# Patient Record
Sex: Female | Born: 1960 | Race: White | Hispanic: No | Marital: Married | Smoking: Current every day smoker
Health system: Southern US, Community
[De-identification: ages and names within clinical notes are randomized; demographics above are authoritative.]

## PROBLEM LIST (undated history)

## (undated) DIAGNOSIS — J45909 Unspecified asthma, uncomplicated: Secondary | ICD-10-CM

## (undated) DIAGNOSIS — I10 Essential (primary) hypertension: Secondary | ICD-10-CM

## (undated) HISTORY — PX: ABDOMINAL HYSTERECTOMY: SHX81

## (undated) HISTORY — PX: OTHER SURGICAL HISTORY: SHX169

## (undated) HISTORY — PX: CHOLECYSTECTOMY: SHX55

## (undated) HISTORY — PX: HERNIA REPAIR: SHX51

---

## 2015-05-24 ENCOUNTER — Encounter (HOSPITAL_COMMUNITY): Payer: Self-pay | Admitting: Emergency Medicine

## 2015-05-24 ENCOUNTER — Emergency Department (HOSPITAL_COMMUNITY): Payer: 59

## 2015-05-24 ENCOUNTER — Emergency Department (HOSPITAL_COMMUNITY)
Admission: EM | Admit: 2015-05-24 | Discharge: 2015-05-24 | Disposition: A | Discharge status: Home / Self Care | Payer: 59 | Attending: Emergency Medicine | Admitting: Emergency Medicine

## 2015-05-24 DIAGNOSIS — J45909 Unspecified asthma, uncomplicated: Secondary | ICD-10-CM | POA: Insufficient documentation

## 2015-05-24 DIAGNOSIS — I10 Essential (primary) hypertension: Secondary | ICD-10-CM | POA: Insufficient documentation

## 2015-05-24 DIAGNOSIS — Z79899 Other long term (current) drug therapy: Secondary | ICD-10-CM | POA: Insufficient documentation

## 2015-05-24 DIAGNOSIS — Z90710 Acquired absence of both cervix and uterus: Secondary | ICD-10-CM | POA: Insufficient documentation

## 2015-05-24 DIAGNOSIS — Z720 Tobacco use: Secondary | ICD-10-CM | POA: Diagnosis not present

## 2015-05-24 DIAGNOSIS — K8500 Idiopathic acute pancreatitis without necrosis or infection: Secondary | ICD-10-CM

## 2015-05-24 DIAGNOSIS — Z9049 Acquired absence of other specified parts of digestive tract: Secondary | ICD-10-CM | POA: Insufficient documentation

## 2015-05-24 DIAGNOSIS — R1012 Left upper quadrant pain: Secondary | ICD-10-CM

## 2015-05-24 HISTORY — DX: Essential (primary) hypertension: I10

## 2015-05-24 HISTORY — DX: Unspecified asthma, uncomplicated: J45.909

## 2015-05-24 LAB — URINALYSIS, ROUTINE W REFLEX MICROSCOPIC
Bilirubin Urine: NEGATIVE
GLUCOSE, UA: NEGATIVE mg/dL
Hgb urine dipstick: NEGATIVE
Ketones, ur: NEGATIVE mg/dL
Leukocytes, UA: NEGATIVE
NITRITE: NEGATIVE
PH: 6 (ref 5.0–8.0)
Protein, ur: NEGATIVE mg/dL
SPECIFIC GRAVITY, URINE: 1.015 (ref 1.005–1.030)
Urobilinogen, UA: 0.2 mg/dL (ref 0.0–1.0)

## 2015-05-24 LAB — COMPREHENSIVE METABOLIC PANEL
ALK PHOS: 62 U/L (ref 38–126)
ALT: 10 U/L — AB (ref 14–54)
AST: 13 U/L — ABNORMAL LOW (ref 15–41)
Albumin: 3.5 g/dL (ref 3.5–5.0)
Anion gap: 10 (ref 5–15)
BILIRUBIN TOTAL: 0.4 mg/dL (ref 0.3–1.2)
BUN: 6 mg/dL (ref 6–20)
CHLORIDE: 99 mmol/L — AB (ref 101–111)
CO2: 25 mmol/L (ref 22–32)
Calcium: 8.9 mg/dL (ref 8.9–10.3)
Creatinine, Ser: 0.64 mg/dL (ref 0.44–1.00)
GFR calc Af Amer: >60 mL/min (ref >60)
Glucose, Bld: 160 mg/dL — ABNORMAL HIGH (ref 65–99)
Potassium: 3.5 mmol/L (ref 3.5–5.1)
SODIUM: 134 mmol/L — AB (ref 135–145)
Total Protein: 6.8 g/dL (ref 6.5–8.1)

## 2015-05-24 LAB — CBC
HCT: 41.1 % (ref 36.0–46.0)
Hemoglobin: 13.8 g/dL (ref 12.0–15.0)
MCH: 30.1 pg (ref 26.0–34.0)
MCHC: 33.6 g/dL (ref 30.0–36.0)
MCV: 89.5 fL (ref 78.0–100.0)
Platelets: 231 10*3/uL (ref 150–400)
RBC: 4.59 MIL/uL (ref 3.87–5.11)
RDW: 13.9 % (ref 11.5–15.5)
WBC: 11.8 10*3/uL — AB (ref 4.0–10.5)

## 2015-05-24 LAB — I-STAT CG4 LACTIC ACID, ED: Lactic Acid, Venous: 0.87 mmol/L (ref 0.5–2.0)

## 2015-05-24 LAB — LIPASE, BLOOD: Lipase: 26 U/L (ref 22–51)

## 2015-05-24 MED ORDER — ONDANSETRON HCL 4 MG/2ML IJ SOLN
4.0000 mg | Freq: Once | INTRAMUSCULAR | Status: AC
  Administered 2015-05-24: 4 mg via INTRAVENOUS
  Filled 2015-05-24: qty 2

## 2015-05-24 MED ORDER — IOHEXOL 300 MG/ML  SOLN: 25.0000 mL | INTRAMUSCULAR | Status: DC

## 2015-05-24 MED ORDER — ONDANSETRON 4 MG PO TBDP: 4.0000 mg | ORAL_TABLET | Freq: Three times a day (TID) | ORAL | Status: AC | PRN

## 2015-05-24 MED ORDER — IOHEXOL 300 MG/ML  SOLN
100.0000 mL | Freq: Once | INTRAMUSCULAR | Status: AC | PRN
  Administered 2015-05-24: 100 mL via INTRAVENOUS

## 2015-05-24 MED ORDER — SODIUM CHLORIDE 0.9 % IV BOLUS (SEPSIS)
1000.0000 mL | Freq: Once | INTRAVENOUS | Status: AC
  Administered 2015-05-24: 1000 mL via INTRAVENOUS

## 2015-05-24 MED ORDER — HYDROCODONE-ACETAMINOPHEN 5-325 MG PO TABS: 1.0000 | ORAL_TABLET | Freq: Four times a day (QID) | ORAL | Status: DC | PRN

## 2015-05-24 MED ORDER — IOHEXOL 300 MG/ML  SOLN
25.0000 mL | INTRAMUSCULAR | Status: AC
  Administered 2015-05-24: 25 mL via ORAL

## 2015-05-24 MED ORDER — FENTANYL CITRATE (PF) 100 MCG/2ML IJ SOLN
50.0000 ug | Freq: Once | INTRAMUSCULAR | Status: AC
Start: 2015-05-24 — End: 2015-05-24
  Administered 2015-05-24: 50 ug via INTRAVENOUS
  Filled 2015-05-24: qty 2

## 2015-05-24 MED ORDER — FENTANYL CITRATE (PF) 100 MCG/2ML IJ SOLN
50.0000 ug | Freq: Once | INTRAMUSCULAR | Status: AC
  Administered 2015-05-24: 50 ug via INTRAVENOUS
  Filled 2015-05-24: qty 2

## 2015-05-24 NOTE — ED Notes (Signed)
Placed on 2L Nasal cannula, desaturated to 92% and able to rebound to 95%.

## 2015-05-24 NOTE — ED Notes (Signed)
Dr. Romeo AppleHarrison made aware that patients pain coming back after prior medication administration, pain 5/10.  CT scan resulted and to come speak with patient and family regarding results.

## 2015-05-24 NOTE — ED Provider Notes (Signed)
1:16 PM mild pancreatitis noted on CT. Labs unremarkable. Patient's pain controlled. Recommended she slowly transition from clears to solids over the next few days. She is tolerating po here. Pain and nausea medicine provided for home. Recommend she return for any worsening.  Clinical Impression 1. Left upper quadrant pain   2. Idiopathic acute pancreatitis      Purvis SheffieldForrest Lenya Sterne, MD 05/24/15 [phone]

## 2015-05-24 NOTE — Discharge Instructions (Signed)
You were seen today for abdominal pain. The cause of your abdominal pain is unknown at this time.  Your workup is reassuring. You need to follow-up with her primary physician.  Abdominal Pain, Women Abdominal (stomach, pelvic, or belly) pain can be caused by many things. It is important to tell your doctor:  The location of the pain.  Does it come and go or is it present all the time?  Are there things that start the pain (eating certain foods, exercise)?  Are there other symptoms associated with the pain (fever, nausea, vomiting, diarrhea)? All of this is helpful to know when trying to find the cause of the pain. CAUSES   Stomach: virus or bacteria infection, or ulcer.  Intestine: appendicitis (inflamed appendix), regional ileitis (Crohn's disease), ulcerative colitis (inflamed colon), irritable bowel syndrome, diverticulitis (inflamed diverticulum of the colon), or cancer of the stomach or intestine.  Gallbladder disease or stones in the gallbladder.  Kidney disease, kidney stones, or infection.  Pancreas infection or cancer.  Fibromyalgia (pain disorder).  Diseases of the female organs:  Uterus: fibroid (non-cancerous) tumors or infection.  Fallopian tubes: infection or tubal pregnancy.  Ovary: cysts or tumors.  Pelvic adhesions (scar tissue).  Endometriosis (uterus lining tissue growing in the pelvis and on the pelvic organs).  Pelvic congestion syndrome (female organs filling up with blood just before the menstrual period).  Pain with the menstrual period.  Pain with ovulation (producing an egg).  Pain with an IUD (intrauterine device, birth control) in the uterus.  Cancer of the female organs.  Functional pain (pain not caused by a disease, may improve without treatment).  Psychological pain.  Depression. DIAGNOSIS  Your doctor will decide the seriousness of your pain by doing an examination.  Blood tests.  X-rays.  Ultrasound.  CT scan (computed  tomography, special type of X-ray).  MRI (magnetic resonance imaging).  Cultures, for infection.  Barium enema (dye inserted in the large intestine, to better view it with X-rays).  Colonoscopy (looking in intestine with a lighted tube).  Laparoscopy (minor surgery, looking in abdomen with a lighted tube).  Major abdominal exploratory surgery (looking in abdomen with a large incision). TREATMENT  The treatment will depend on the cause of the pain.   Many cases can be observed and treated at home.  Over-the-counter medicines recommended by your caregiver.  Prescription medicine.  Antibiotics, for infection.  Birth control pills, for painful periods or for ovulation pain.  Hormone treatment, for endometriosis.  Nerve blocking injections.  Physical therapy.  Antidepressants.  Counseling with a psychologist or psychiatrist.  Minor or major surgery. HOME CARE INSTRUCTIONS   Do not take laxatives, unless directed by your caregiver.  Take over-the-counter pain medicine only if ordered by your caregiver. Do not take aspirin because it can cause an upset stomach or bleeding.  Try a clear liquid diet (broth or water) as ordered by your caregiver. Slowly move to a bland diet, as tolerated, if the pain is related to the stomach or intestine.  Have a thermometer and take your temperature several times a day, and record it.  Bed rest and sleep, if it helps the pain.  Avoid sexual intercourse, if it causes pain.  Avoid stressful situations.  Keep your follow-up appointments and tests, as your caregiver orders.  If the pain does not go away with medicine or surgery, you may try:  Acupuncture.  Relaxation exercises (yoga, meditation).  Group therapy.  Counseling. SEEK MEDICAL CARE IF:   You notice  certain foods cause stomach pain.  Your home care treatment is not helping your pain.  You need stronger pain medicine.  You want your IUD removed.  You feel faint  or lightheaded.  You develop nausea and vomiting.  You develop a rash.  You are having side effects or an allergy to your medicine. SEEK IMMEDIATE MEDICAL CARE IF:   Your pain does not go away or gets worse.  You have a fever.  Your pain is felt only in portions of the abdomen. The right side could possibly be appendicitis. The left lower portion of the abdomen could be colitis or diverticulitis.  You are passing blood in your stools (bright red or black tarry stools, with or without vomiting).  You have blood in your urine.  You develop chills, with or without a fever.  You pass out. MAKE SURE YOU:   Understand these instructions.  Will watch your condition.  Will get help right away if you are not doing well or get worse. Document Released: 08/24/2007 Document Revised: 03/13/2014 Document Reviewed: 09/13/2009 [facility] Patient Information 2015 [place], [place]. This information is not intended to replace advice given to you by your health care provider. Make sure you discuss any questions you have with your health care provider.

## 2015-05-24 NOTE — ED Notes (Signed)
CT notified patient completed oral contrast.  Patient marked ready for transport.

## 2015-05-24 NOTE — ED Provider Notes (Signed)
CSN: [id number]     Arrival date & time 05/24/15  0356 History   First MD Initiated Contact with Patient 05/24/15 [phone]     Chief Complaint  Patient presents with  . Abdominal Pain     (Consider location/radiation/quality/duration/timing/severity/associated sxs/prior Treatment) HPI  This a 54 year old female with a history of hypertension and asthma who presents with left upper quadrant pain.  Patient reports pain started on Saturday. It is sharp and radiates to her back. She states "I feel like something is going to bust." She reports vomiting and one episode of diarrhea. She reports decreased by mouth intake. Current pain is 8 out of 10. She has taken Tylenol at home without relief. She reports that she has had intermittent pain similar to this in the past over the last several years. She states that it normally lasts 2 days. She is having never had a known diagnosis. She denies any fevers, urinary symptoms, hematuria.  Of note, have reviewed patient's records. She had a CT scan in [facility] which was negative for similar presentation.  Past Medical History  Diagnosis Date  . Hypertension   . Asthma    Past Surgical History  Procedure Laterality Date  . Cholecystectomy    . Abdominal hysterectomy     History reviewed. No pertinent family history. History  Substance Use Topics  . Smoking status: Current Every Day Smoker -- 1.00 packs/day    Types: Cigarettes  . Smokeless tobacco: Not on file  . Alcohol Use: Yes     Comment: occ   OB History    No data available     Review of Systems  Constitutional: Negative for fever.  Respiratory: Negative for cough, chest tightness and shortness of breath.   Cardiovascular: Negative for chest pain.  Gastrointestinal: Positive for vomiting and abdominal pain. Negative for nausea.  Genitourinary: Negative for dysuria.  Musculoskeletal: Negative for back pain.  Neurological: Negative for headaches.  All other systems reviewed  and are negative.     Allergies  Naproxen; Codeine; Demerol; and Morphine and related  Home Medications   Prior to Admission medications   Medication Sig Start Date End Date Taking? Authorizing Provider  albuterol (PROVENTIL HFA;VENTOLIN HFA) 108 (90 BASE) MCG/ACT inhaler Inhale 1-2 puffs into the lungs every 6 (six) hours as needed for wheezing or shortness of breath.   Yes Historical Provider, MD  fexofenadine (ALLEGRA) 180 MG tablet Take 180 mg by mouth daily.   Yes Historical Provider, MD  lisinopril (PRINIVIL,ZESTRIL) 30 MG tablet Take 30 mg by mouth daily. 05/04/15  Yes Historical Provider, MD  omeprazole (PRILOSEC) 40 MG capsule Take 40 mg by mouth daily.  05/16/15  Yes Historical Provider, MD   BP 119/60 mmHg  Pulse 85  Temp(Src) 97.7 F (36.5 C) (Oral)  Resp 16  SpO2 99% Physical Exam  Constitutional: She is oriented to person, place, and time. She appears well-developed and well-nourished. No distress.  HENT:  Head: Normocephalic and atraumatic.  Cardiovascular: Normal rate, regular rhythm and normal heart sounds.   Pulmonary/Chest: Effort normal. No respiratory distress. She has no wheezes.  Abdominal: Soft. Bowel sounds are normal. There is no tenderness. There is no rebound and no guarding.  Neurological: She is alert and oriented to person, place, and time.  Skin: Skin is warm and dry.  Psychiatric: She has a normal mood and affect.  Nursing note and vitals reviewed.   ED Course  Procedures (including critical care time) Labs Review Labs Reviewed  COMPREHENSIVE METABOLIC PANEL - Abnormal; Notable for the following:    Sodium 134 (*)    Chloride 99 (*)    Glucose, Bld 160 (*)    AST 13 (*)    ALT 10 (*)    All other components within normal limits  CBC - Abnormal; Notable for the following:    WBC 11.8 (*)    All other components within normal limits  URINALYSIS, ROUTINE W REFLEX MICROSCOPIC (NOT AT [facility]) - Abnormal; Notable for the following:     APPearance CLOUDY (*)    All other components within normal limits  LIPASE, BLOOD  I-STAT CG4 LACTIC ACID, ED    Imaging Review Dg Abd Acute W/chest  05/24/2015   CLINICAL DATA:  Left upper quadrant pain, nausea and vomiting since Sunday.  EXAM: DG ABDOMEN ACUTE W/ 1V CHEST  COMPARISON:  Chest 11/08/2013  FINDINGS: Normal heart size and pulmonary vascularity. No focal airspace disease or consolidation in the lungs. No blunting of costophrenic angles. No pneumothorax. Mediastinal contours appear intact.  Scattered gas and stool in the colon with few gas-filled nondilated small bowel loops. No small or large bowel distention. No abnormal air-fluid levels. No free intra-abdominal air. No radiopaque stones. Surgical clips in the right upper quadrant. Mild degenerative changes in the spine.  IMPRESSION: No evidence of active pulmonary disease. Normal nonobstructive bowel gas pattern.   Electronically Signed   By: Burman Nieves M.D.   On: 05/24/2015 06:26     EKG Interpretation None      MDM   Final diagnoses:  None    Patient presents with LUQ pain.  Similar symptoms in the past. Nontoxic. No signs of peritonitis. Patient has no significant other symptoms. She does report decreased by mouth intake.  Lab workup times. No significant evidence of dehydration. Largely reassuring. Acute abdominal series without evidence of obstruction.  Discussed results with the patient. She is very tearful. She states "I just want to know what's wrong." I discussed with the patient did not feel that a CT scan would be high yield. However, the patient reports continued pain.    Shon Baton, MD 05/24/15 [phone]

## 2015-05-24 NOTE — ED Notes (Signed)
Patient here with LUQ abdominal pain starting Sunday. Unable to tolerate food and drink. Reports history of similar without diagnosis by MD, but has seen multiple MDs for the same. Episodes usually last 2 days.

## 2015-07-05 ENCOUNTER — Emergency Department (HOSPITAL_COMMUNITY)
Admission: EM | Admit: 2015-07-05 | Discharge: 2015-07-05 | Disposition: A | Discharge status: Home / Self Care | Payer: 59 | Attending: Emergency Medicine | Admitting: Emergency Medicine

## 2015-07-05 ENCOUNTER — Emergency Department (HOSPITAL_COMMUNITY): Payer: 59

## 2015-07-05 ENCOUNTER — Encounter (HOSPITAL_COMMUNITY): Payer: Self-pay

## 2015-07-05 DIAGNOSIS — Z720 Tobacco use: Secondary | ICD-10-CM | POA: Diagnosis not present

## 2015-07-05 DIAGNOSIS — J45909 Unspecified asthma, uncomplicated: Secondary | ICD-10-CM | POA: Diagnosis not present

## 2015-07-05 DIAGNOSIS — K859 Acute pancreatitis, unspecified: Secondary | ICD-10-CM

## 2015-07-05 DIAGNOSIS — I10 Essential (primary) hypertension: Secondary | ICD-10-CM | POA: Insufficient documentation

## 2015-07-05 DIAGNOSIS — R1084 Generalized abdominal pain: Secondary | ICD-10-CM

## 2015-07-05 DIAGNOSIS — Z79899 Other long term (current) drug therapy: Secondary | ICD-10-CM | POA: Insufficient documentation

## 2015-07-05 DIAGNOSIS — R1012 Left upper quadrant pain: Secondary | ICD-10-CM | POA: Diagnosis present

## 2015-07-05 LAB — COMPREHENSIVE METABOLIC PANEL
ALK PHOS: 63 U/L (ref 38–126)
ALT: 14 U/L (ref 14–54)
AST: 19 U/L (ref 15–41)
Albumin: 3.8 g/dL (ref 3.5–5.0)
Anion gap: 9 (ref 5–15)
BUN: 5 mg/dL — ABNORMAL LOW (ref 6–20)
CALCIUM: 9.3 mg/dL (ref 8.9–10.3)
CO2: 26 mmol/L (ref 22–32)
Chloride: 102 mmol/L (ref 101–111)
Creatinine, Ser: 0.53 mg/dL (ref 0.44–1.00)
Glucose, Bld: 142 mg/dL — ABNORMAL HIGH (ref 65–99)
Potassium: 3.5 mmol/L (ref 3.5–5.1)
Sodium: 137 mmol/L (ref 135–145)
Total Bilirubin: 0.3 mg/dL (ref 0.3–1.2)
Total Protein: 6.9 g/dL (ref 6.5–8.1)

## 2015-07-05 LAB — URINALYSIS, ROUTINE W REFLEX MICROSCOPIC
Bilirubin Urine: NEGATIVE
Glucose, UA: NEGATIVE mg/dL
HGB URINE DIPSTICK: NEGATIVE
KETONES UR: NEGATIVE mg/dL
Leukocytes, UA: NEGATIVE
Nitrite: NEGATIVE
PROTEIN: NEGATIVE mg/dL
Specific Gravity, Urine: 1.005 (ref 1.005–1.030)
UROBILINOGEN UA: 0.2 mg/dL (ref 0.0–1.0)
pH: 6.5 (ref 5.0–8.0)

## 2015-07-05 LAB — CBC
HCT: 42.4 % (ref 36.0–46.0)
HEMOGLOBIN: 13.9 g/dL (ref 12.0–15.0)
MCH: 29.1 pg (ref 26.0–34.0)
MCHC: 32.8 g/dL (ref 30.0–36.0)
MCV: 88.7 fL (ref 78.0–100.0)
Platelets: 243 10*3/uL (ref 150–400)
RBC: 4.78 MIL/uL (ref 3.87–5.11)
RDW: 13.9 % (ref 11.5–15.5)
WBC: 9.1 10*3/uL (ref 4.0–10.5)

## 2015-07-05 LAB — LIPASE, BLOOD: Lipase: 42 U/L (ref 22–51)

## 2015-07-05 MED ORDER — HYDROMORPHONE HCL 1 MG/ML IJ SOLN
1.0000 mg | Freq: Once | INTRAMUSCULAR | Status: AC
  Administered 2015-07-05: 1 mg via INTRAVENOUS
  Filled 2015-07-05: qty 1

## 2015-07-05 MED ORDER — SODIUM CHLORIDE 0.9 % IV BOLUS (SEPSIS)
1000.0000 mL | Freq: Once | INTRAVENOUS | Status: AC
  Administered 2015-07-05: 1000 mL via INTRAVENOUS

## 2015-07-05 MED ORDER — PROMETHAZINE HCL 25 MG PO TABS: 25.0000 mg | ORAL_TABLET | Freq: Three times a day (TID) | ORAL | Status: AC | PRN

## 2015-07-05 MED ORDER — PROMETHAZINE HCL 25 MG/ML IJ SOLN
25.0000 mg | Freq: Once | INTRAMUSCULAR | Status: AC
  Administered 2015-07-05: 25 mg via INTRAVENOUS
  Filled 2015-07-05: qty 1

## 2015-07-05 MED ORDER — IOHEXOL 300 MG/ML  SOLN: 25.0000 mL | INTRAMUSCULAR | Status: DC

## 2015-07-05 MED ORDER — IOHEXOL 300 MG/ML  SOLN
25.0000 mL | INTRAMUSCULAR | Status: AC
  Administered 2015-07-05: 25 mL via ORAL

## 2015-07-05 MED ORDER — IOHEXOL 300 MG/ML  SOLN
100.0000 mL | Freq: Once | INTRAMUSCULAR | Status: AC | PRN
  Administered 2015-07-05: 100 mL via INTRAVENOUS

## 2015-07-05 MED ORDER — HYDROCODONE-ACETAMINOPHEN 10-325 MG PO TABS: 1.0000 | ORAL_TABLET | ORAL | Status: AC | PRN

## 2015-07-05 NOTE — ED Notes (Signed)
CT aware of pt being done with contrast, pt ambulated to the bathroom with ease

## 2015-07-05 NOTE — ED Notes (Signed)
Per the pt she started experiencing left sided flank pain that "goes around to the back" around 5 pm yesterday. Pt has taken 2 vicodin to help with pain, pt states "it helped enough to help me doze off to sleep, but then I woke right back up".

## 2015-07-05 NOTE — Discharge Instructions (Signed)
Return here as needed. Follow up with the GI doctor.  °

## 2015-07-05 NOTE — ED Provider Notes (Signed)
CSN: [id number]     Arrival date & time 07/05/15  0515 History   First MD Initiated Contact with Patient 07/05/15 [phone]     Chief Complaint  Patient presents with  . Flank Pain     (Consider location/radiation/quality/duration/timing/severity/associated sxs/prior Treatment) HPI  Patient is a 54 yo female with a history of cholecystectomy who complains of LUQ abdominal pain that radiates to the back. She has been having these episodes off and on and was last seen in the ED for the same complaint 1 month ago. Patient describes her pain as sharpe and stabbing in nature and rates it a 10 on the pain scale. She is unable to pinpoint what brings these episodes on but does say it sometimes happens after she has eaten. She takes norco/vicodin for the pain and zofran to help control the nausea. This takes the edge off but does not completely resolve her symptoms. She states these episodes happen about once every week. Last night the pain started around 5, she took a vicodin at 7, 11, and then 1 and the pain still woke her up from sleep. She endorses fatigue, night sweats, N/V/D, with the diarrhea occuring off and on. Her last BM was yesterday. She states that she feels SOB, and experiences left sides CP when the episodes occur. She has had some weight loss recently due pain after eating. She denies fever, chills, palpitations.   Past Medical History  Diagnosis Date  . Hypertension   . Asthma    Past Surgical History  Procedure Laterality Date  . Cholecystectomy    . Abdominal hysterectomy    . Hernia repair    . Milk duct removal     No family history on file. Social History  Substance Use Topics  . Smoking status: Current Every Day Smoker -- 1.00 packs/day    Types: Cigarettes  . Smokeless tobacco: None  . Alcohol Use: Yes     Comment: occ   OB History    No data available     Review of Systems ROS negative unless otherwise specified by HPI.    Allergies  Naproxen; Codeine; Demerol;  and Morphine and related  Home Medications   Prior to Admission medications   Medication Sig Start Date End Date Taking? Authorizing Provider  albuterol (PROVENTIL HFA;VENTOLIN HFA) 108 (90 BASE) MCG/ACT inhaler Inhale 1-2 puffs into the lungs every 6 (six) hours as needed for wheezing or shortness of breath.    Historical Provider, MD  fexofenadine (ALLEGRA) 180 MG tablet Take 180 mg by mouth daily.    Historical Provider, MD  HYDROcodone-acetaminophen (NORCO/VICODIN) 5-325 MG per tablet Take 1-2 tablets by mouth every 6 (six) hours as needed. 05/24/15   Shon Baton, MD  lisinopril (PRINIVIL,ZESTRIL) 30 MG tablet Take 30 mg by mouth daily. 05/04/15   Historical Provider, MD  omeprazole (PRILOSEC) 40 MG capsule Take 40 mg by mouth daily.  05/16/15   Historical Provider, MD  ondansetron (ZOFRAN ODT) 4 MG disintegrating tablet Take 1 tablet (4 mg total) by mouth every 8 (eight) hours as needed for nausea or vomiting. 05/24/15   Shon Baton, MD   BP 156/80 mmHg  Pulse 87  Temp(Src) 98.2 F (36.8 C) (Oral)  Resp 21  SpO2 98% Physical Exam  Constitutional: She is oriented to person, place, and time. She appears well-developed and well-nourished.  HENT:  Head: Normocephalic and atraumatic.  Mouth/Throat: Oropharynx is clear and moist.  Eyes: Pupils are equal, round, and reactive to  light.  Neck: Normal range of motion. Neck supple.  Cardiovascular: Normal rate, regular rhythm and normal heart sounds.  Exam reveals no gallop and no friction rub.   No murmur heard. Pulmonary/Chest: Effort normal and breath sounds normal. No respiratory distress. She has no wheezes. She has no rales.  Abdominal: Soft. Bowel sounds are normal. She exhibits no distension and no mass. There is tenderness. There is no rebound and no guarding.  Tenderness to light and deep palpation of LUQ   CVA tenderness noted on the left side, with pain occuring in the LLQ.   Musculoskeletal: She exhibits no edema.   Neurological: She is alert and oriented to person, place, and time. She exhibits normal muscle tone. Coordination normal.  Skin: Skin is warm and dry. No erythema.  Psychiatric: She has a normal mood and affect. Her behavior is normal.  Nursing note and vitals reviewed.   ED Course  Procedures (including critical care time) Labs Review Labs Reviewed  COMPREHENSIVE METABOLIC PANEL - Abnormal; Notable for the following:    Glucose, Bld 142 (*)    BUN 5 (*)    All other components within normal limits  URINALYSIS, ROUTINE W REFLEX MICROSCOPIC (NOT AT [facility]) - Abnormal; Notable for the following:    APPearance HAZY (*)    All other components within normal limits  LIPASE, BLOOD  CBC    Imaging Review Ct Abdomen Pelvis W Contrast  07/05/2015   CLINICAL DATA:  Acute left upper quadrant abdominal pain.  EXAM: CT ABDOMEN AND PELVIS WITH CONTRAST  TECHNIQUE: Multidetector CT imaging of the abdomen and pelvis was performed using the standard protocol following bolus administration of intravenous contrast.  CONTRAST:  OMNIPAQUE IOHEXOL 300 MG/ML  SOLN  COMPARISON:  CT scan of May 24, 2015 and June 16, 2014.  FINDINGS: Visualized lung bases appear normal. No significant osseous abnormality is noted.  Status post cholecystectomy. The liver and spleen appear normal. There is continued presence of minimal to mild inflammatory changes around the body of the pancreas consistent with acute pancreatitis. Small lymph nodes are noted around this portion of the pancreatic body which are unchanged and most likely reactive or inflammatory in etiology. This is slightly improved compared to prior exam. Stable bilateral adrenal adenomas are noted. No hydronephrosis or renal obstruction is noted. Kidneys appear normal. No renal or ureteral calculi are noted. The appendix appears normal. There is no evidence of bowel obstruction. Atherosclerosis of abdominal aorta is noted without aneurysm formation. No abnormal  fluid collection is noted. Urinary bladder appears normal. Status post hysterectomy. Stable mildly enlarged retroperitoneal lymph nodes are noted which most likely are reactive.  IMPRESSION: Atherosclerosis of abdominal aorta is noted without aneurysm formation.  Status post cholecystectomy and hysterectomy.  Stable bilateral adrenal adenomas are noted.  Minimal to mild inflammatory changes are noted around the body of the pancreas which are slightly improved compared to prior exam. This suggests pancreatitis and correlation with laboratory data is recommended.   Electronically Signed   By: Lupita Raider, M.D.   On: 07/05/2015 08:34   I have personally reviewed and evaluated these images and lab results as part of my medical decision-making.   Patient will need follow-up with GI.  The patient and her husband seem frustrated that we do not have an answer to the problem.  I did say that in our testing here today did not show any significant abnormality and that he did have to rely on our partners in  the community for further help and evaluation.  Patient agrees to the plan and all questions were answered.  The patient does still have some inflammation around her pancreas which could explain the pain   Charlestine Night, PA-C 07/05/15 [address], PA-C 07/05/15 [id number]  Pricilla Loveless, MD 07/13/15 [phone]

## 2016-03-11 IMAGING — DX DG ABDOMEN ACUTE W/ 1V CHEST
3 series · 3 of 3 positions shown · non-contrast
Comparison: Chest 11/08/2013

CLINICAL DATA: Left upper quadrant pain, nausea and vomiting since
[REDACTED].

EXAM:
DG ABDOMEN ACUTE W/ 1V CHEST

[chest pa]
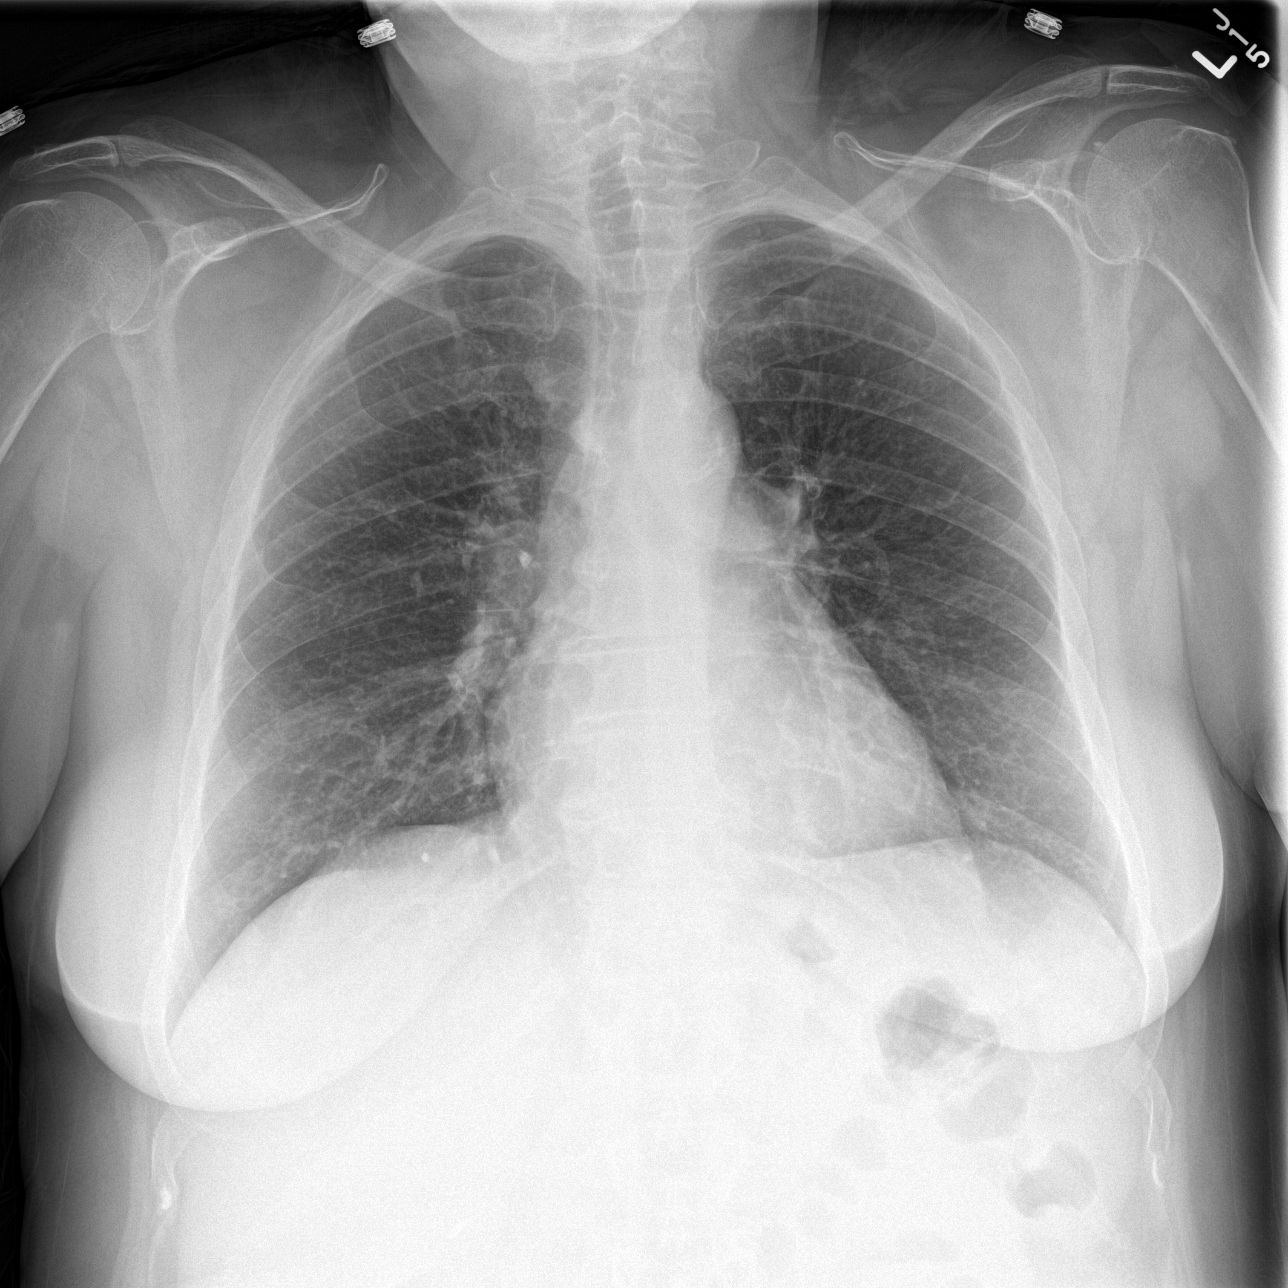

[abdomen erect]
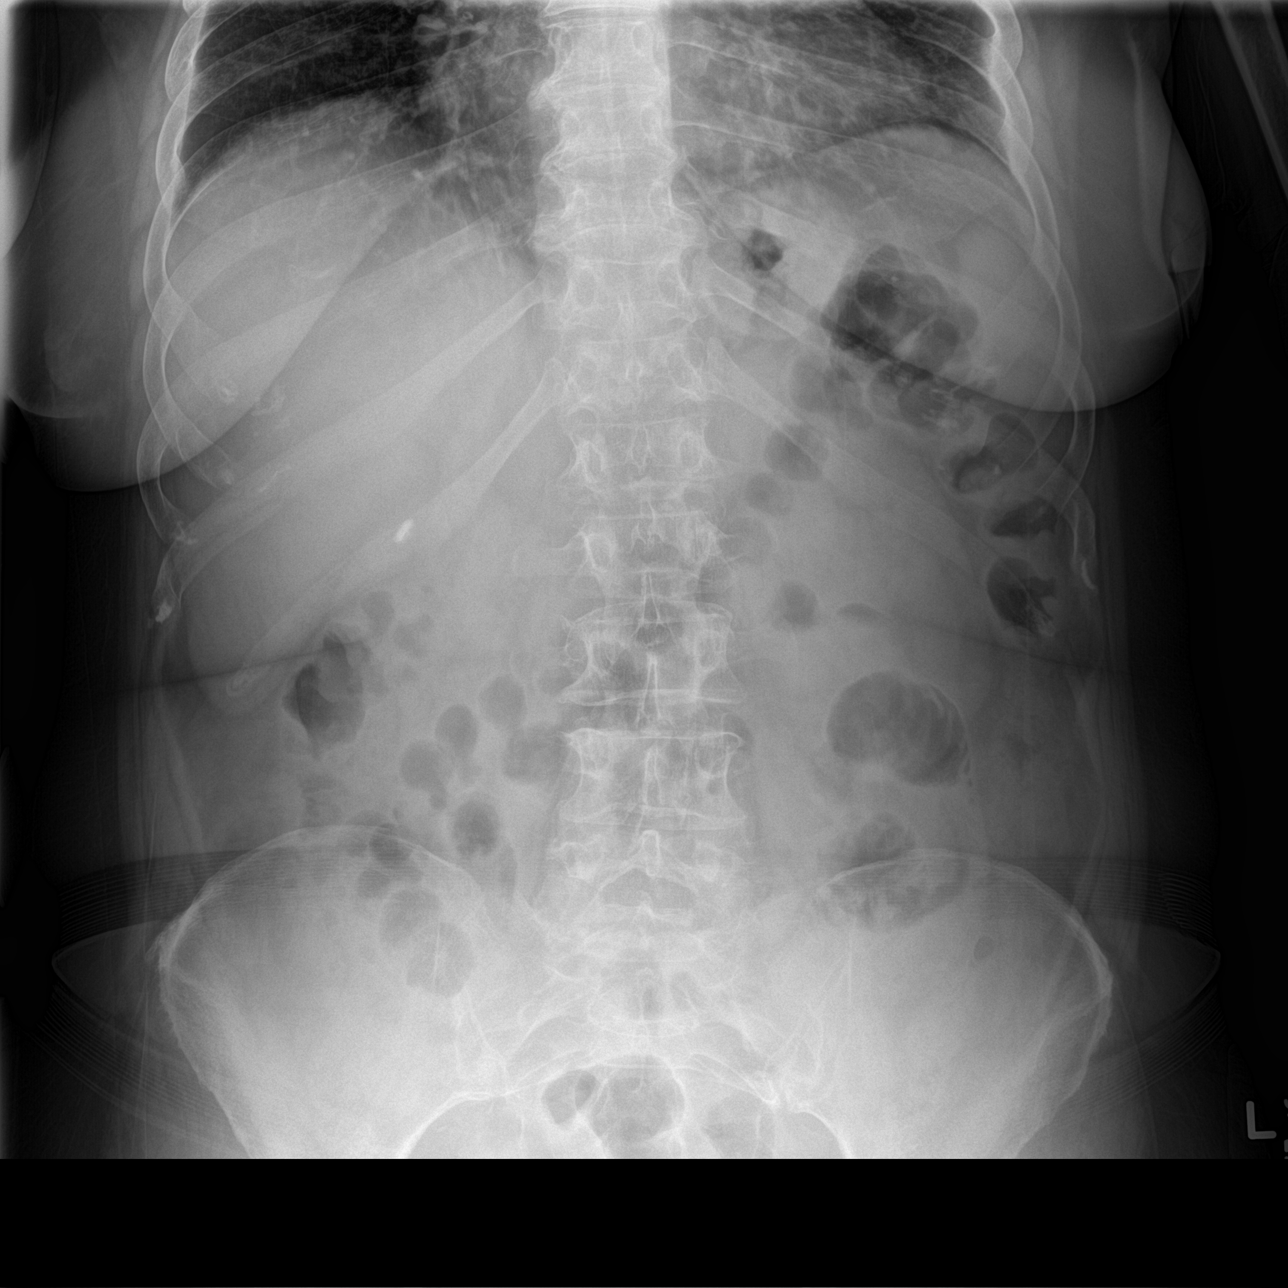

[abdomen supine]
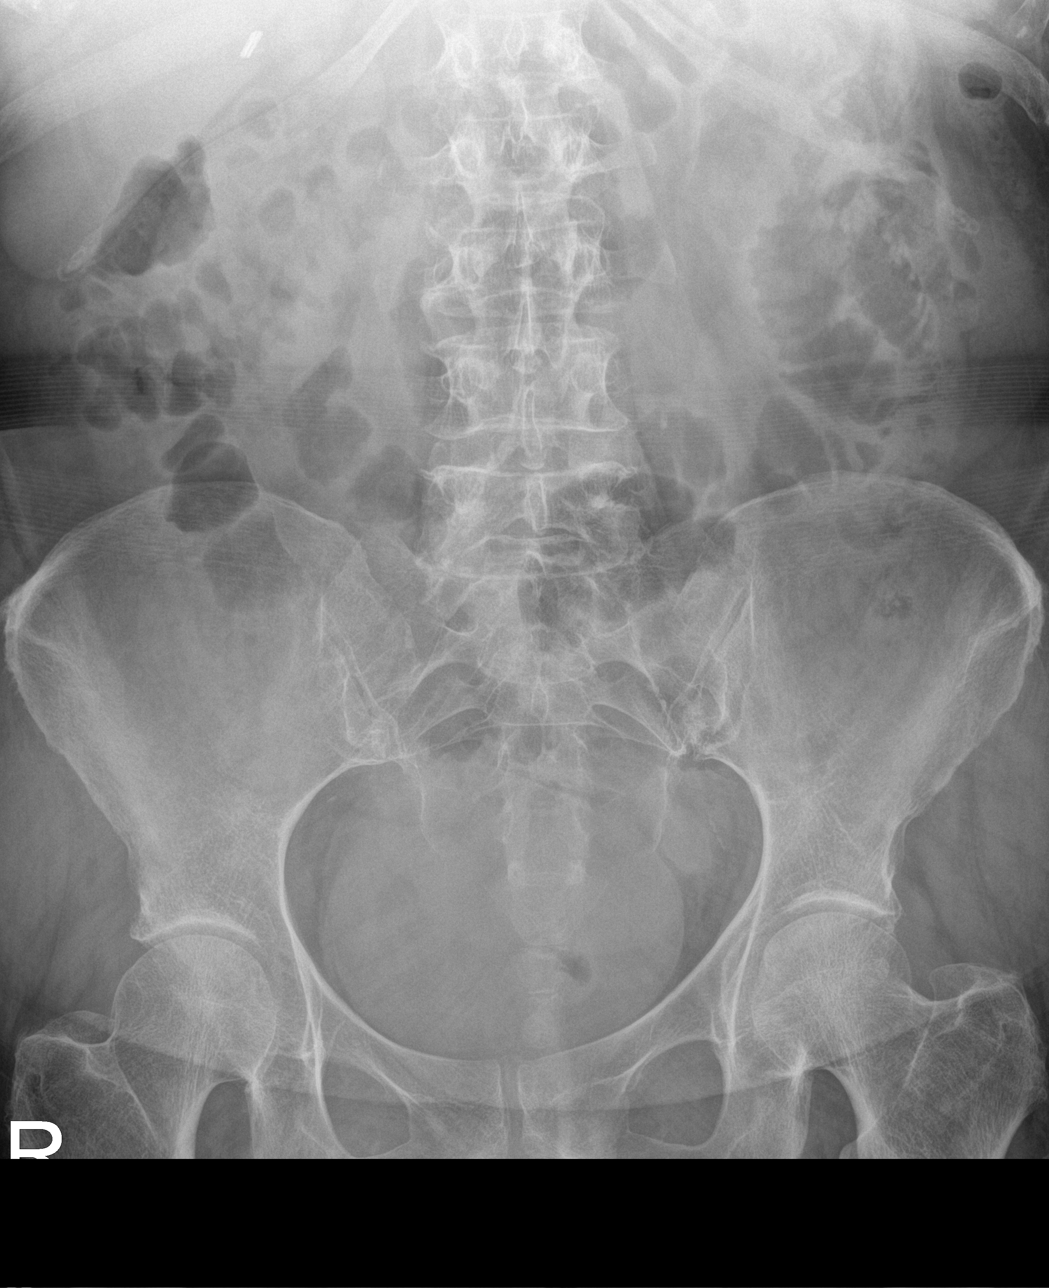

[3 of 3 positions shown; findings below may reference images not displayed]

FINDINGS: Normal heart size and pulmonary vascularity. No focal airspace
disease or consolidation in the lungs. No blunting of costophrenic
angles. No pneumothorax. Mediastinal contours appear intact.

Scattered gas and stool in the colon with few gas-filled nondilated
small bowel loops. No small or large bowel distention. No abnormal
air-fluid levels. No free intra-abdominal air. No radiopaque stones.
Surgical clips in the right upper quadrant. Mild degenerative
changes in the spine.
IMPRESSION: No evidence of active pulmonary disease. Normal nonobstructive bowel
gas pattern.

## 2016-03-11 IMAGING — CT CT ABD-PELV W/ CM
2 of 5 series · 16 of 46 positions shown, 18 images · IV contrast (omnipaque)
Comparison: CT scan of June 16, 2014.

CLINICAL DATA: Acute left upper quadrant abdominal pain.

EXAM:
CT ABDOMEN AND PELVIS WITH CONTRAST
TECHNIQUE: Multidetector CT imaging of the abdomen and pelvis was performed
using the standard protocol following bolus administration of
intravenous contrast.
CONTRAST:  100mL OMNIPAQUE IOHEXOL 300 MG/ML  SOLN

[Series 2: abd/ pelvis 5.0 i30f 1 · axial · 0.75mm/px · z∈[-383,+42]mm · 13 of 97 slices shown, 15 images]
[im 6/97  soft-tissue]
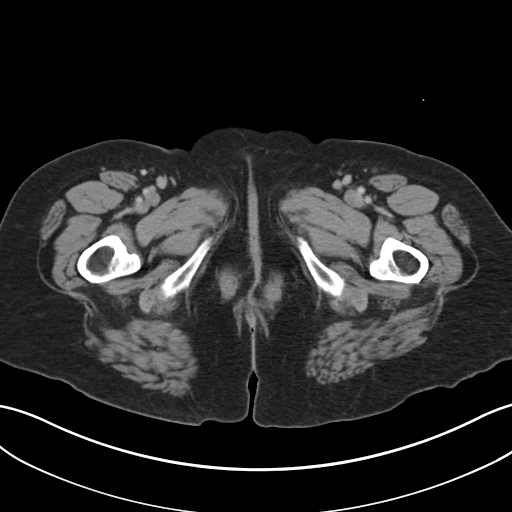
[im 6/97  bone]
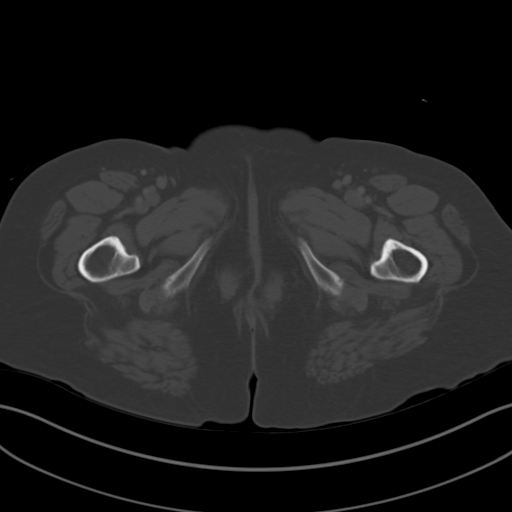
[im 11/97  soft-tissue]
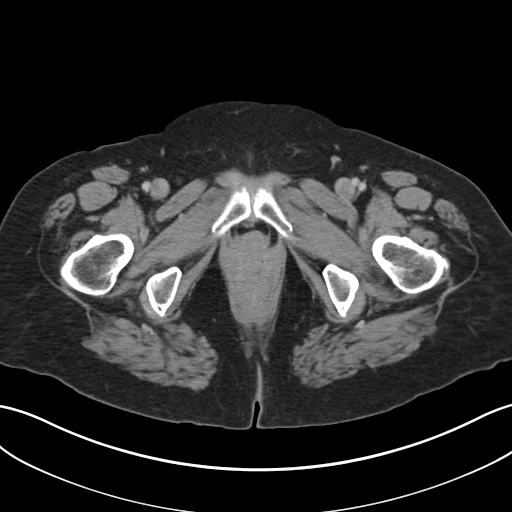
[im 22/97  soft-tissue]
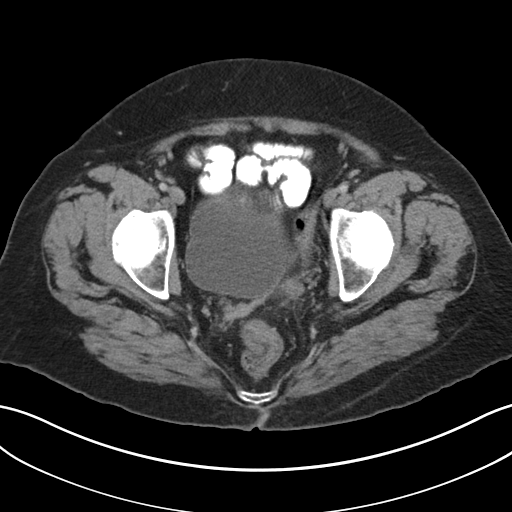
[im 27/97  soft-tissue]
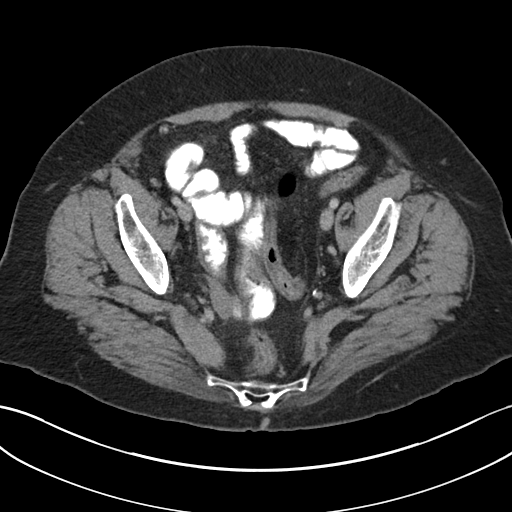
[im 33/97  soft-tissue]
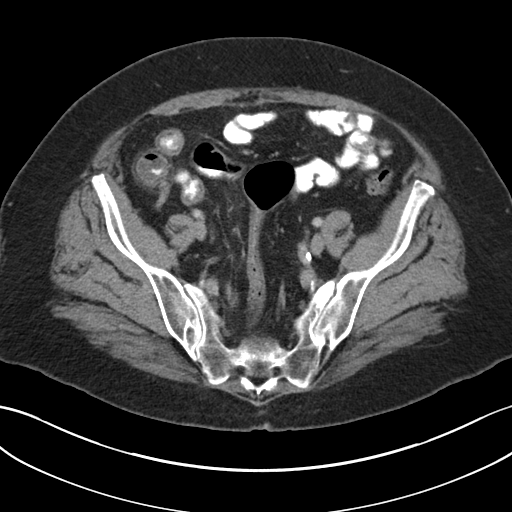
[im 43/97  soft-tissue]
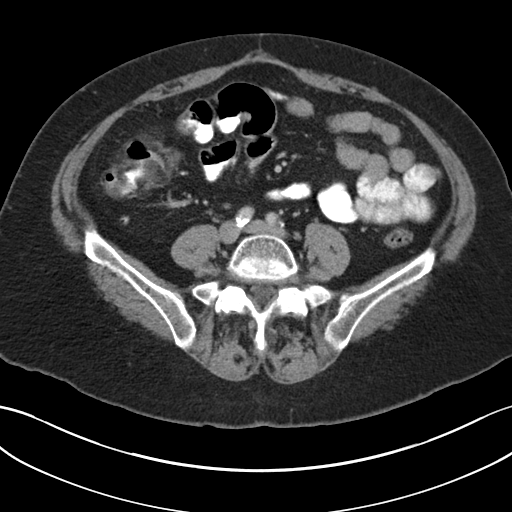
[im 49/97  soft-tissue]
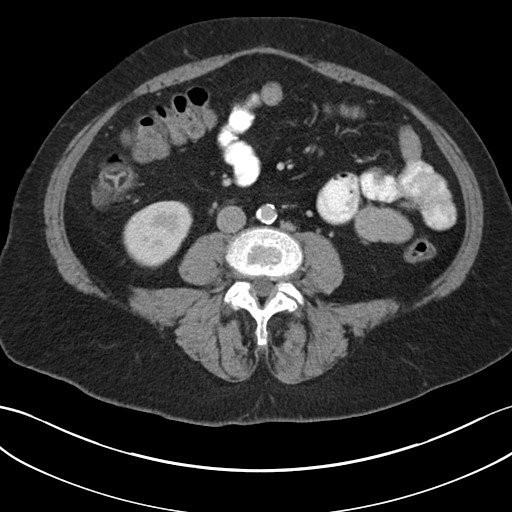
[im 54/97  soft-tissue]
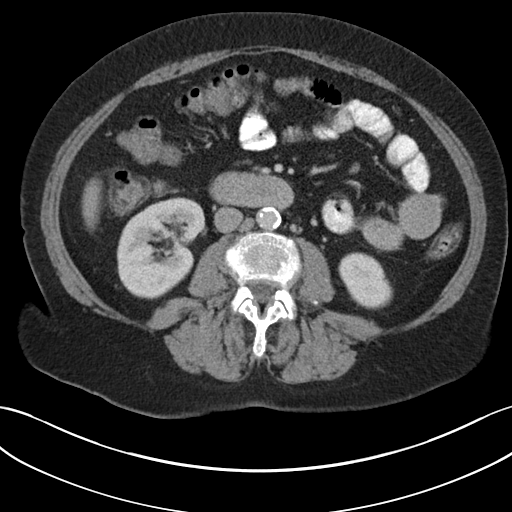
[im 65/97  soft-tissue]
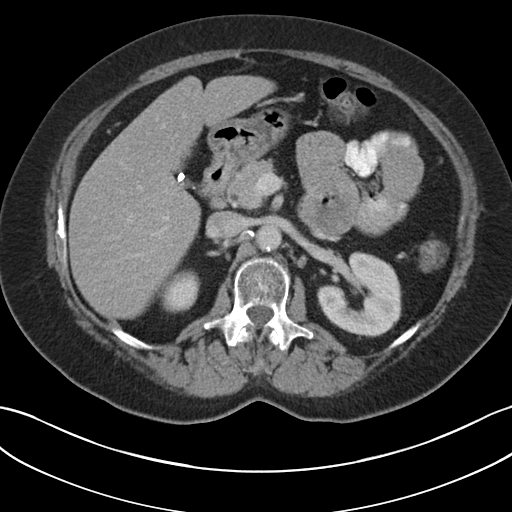
[im 65/97  bone]
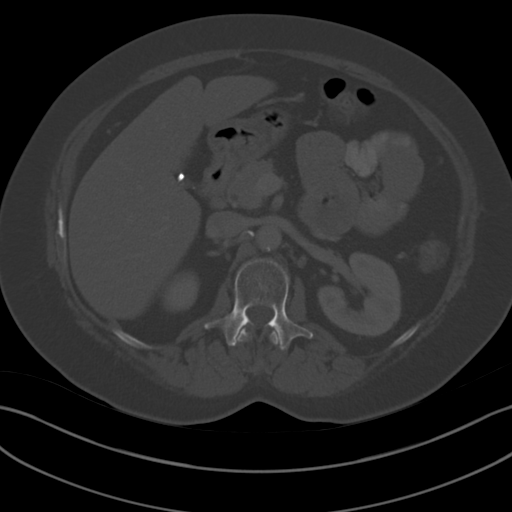
[im 70/97  soft-tissue]
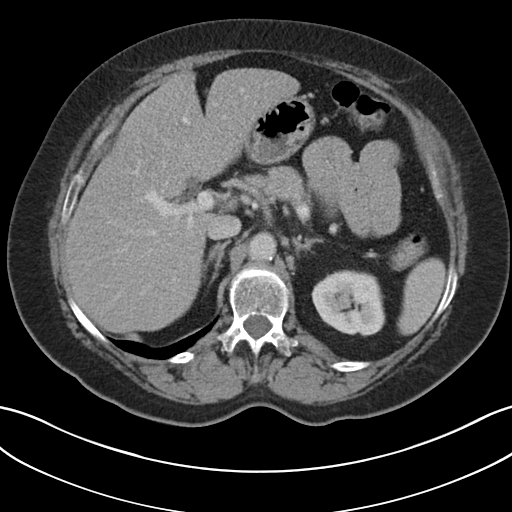
[im 75/97  soft-tissue]
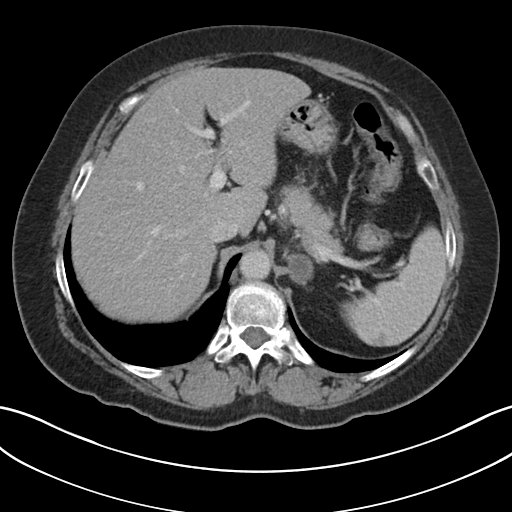
[im 86/97  soft-tissue]
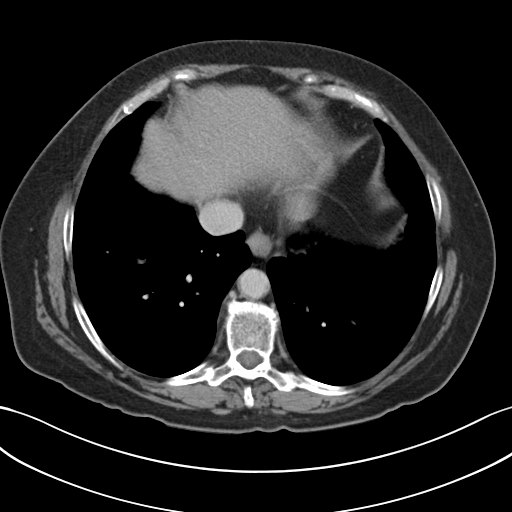
[im 91/97  soft-tissue]
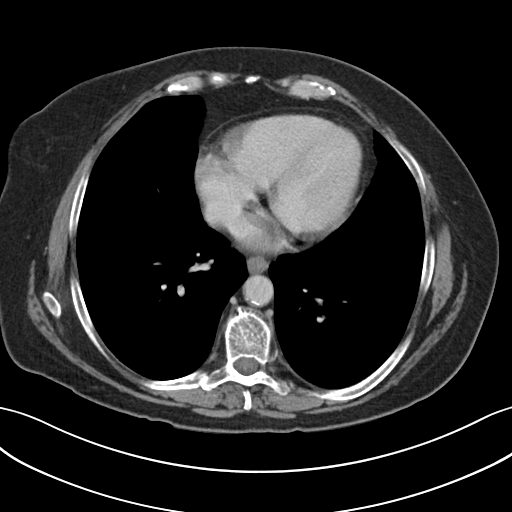

[Series 5: coronals · coronal · 0.70mm/px · 3 of 155 slices shown]
[im 52/155  soft-tissue]
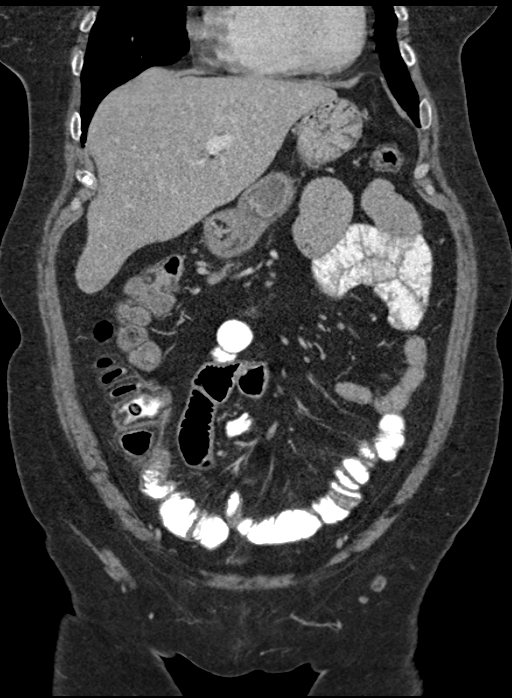
[im 69/155  soft-tissue]
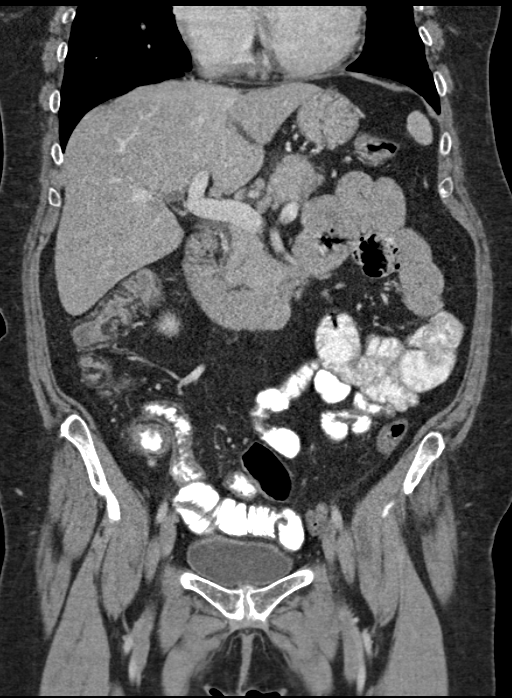
[im 86/155  soft-tissue]
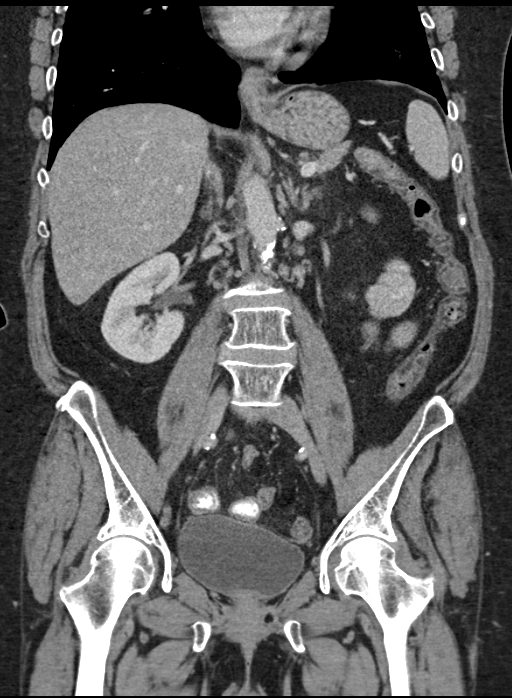

[16 of 46 positions shown; findings below may reference images not displayed]

FINDINGS: Visualized lung bases appear normal. No significant osseous
abnormality is noted.

Status post cholecystectomy. The liver and spleen appear normal.
Minimal inflammatory changes are noted around the body of the
pancreas concerning for pancreatitis. Stable bilateral adrenal
adenomas are noted. The kidneys appear normal. No hydronephrosis or
renal obstruction is noted. No renal or ureteral calculi are noted.
Atherosclerosis of abdominal aorta is noted without aneurysm
formation. The appendix appears normal. There is no evidence of
bowel obstruction. No abnormal fluid collection is noted. Urinary
bladder appears normal. Status post hysterectomy. Ovaries are not
clearly visualized. No significant adenopathy is noted.
IMPRESSION: Stable bilateral adrenal adenomas.

Atherosclerosis of abdominal aorta is noted without aneurysm
formation.

Minimal inflammatory changes are noted around the body of the
pancreas suggesting acute pancreatitis. Clinical correlation is
recommended.

## 2016-08-13 DIAGNOSIS — E611 Iron deficiency: Secondary | ICD-10-CM | POA: Diagnosis not present

## 2016-10-28 DIAGNOSIS — D509 Iron deficiency anemia, unspecified: Secondary | ICD-10-CM | POA: Diagnosis not present

## 2017-04-28 DIAGNOSIS — Z862 Personal history of diseases of the blood and blood-forming organs and certain disorders involving the immune mechanism: Secondary | ICD-10-CM | POA: Diagnosis not present

## 2018-08-26 DIAGNOSIS — Z862 Personal history of diseases of the blood and blood-forming organs and certain disorders involving the immune mechanism: Secondary | ICD-10-CM | POA: Diagnosis not present

## 2018-11-26 DIAGNOSIS — D509 Iron deficiency anemia, unspecified: Secondary | ICD-10-CM | POA: Diagnosis not present

## 2019-04-05 DIAGNOSIS — D509 Iron deficiency anemia, unspecified: Secondary | ICD-10-CM

## 2019-07-12 DEATH — deceased
